# Patient Record
Sex: Male | Born: 2005 | Race: White | Hispanic: No | Marital: Single | Smoking: Never smoker
Health system: Southern US, Community
[De-identification: ages and names within clinical notes are randomized; demographics above are authoritative.]

---
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
## 2023-02-10 ENCOUNTER — Emergency Department: Payer: Managed Care, Other (non HMO)

## 2023-02-10 DIAGNOSIS — W500XXA Accidental hit or strike by another person, initial encounter: Secondary | ICD-10-CM | POA: Insufficient documentation

## 2023-02-10 DIAGNOSIS — S8992XA Unspecified injury of left lower leg, initial encounter: Secondary | ICD-10-CM | POA: Diagnosis present

## 2023-02-10 DIAGNOSIS — S8012XA Contusion of left lower leg, initial encounter: Secondary | ICD-10-CM | POA: Diagnosis not present

## 2023-02-10 DIAGNOSIS — Y9367 Activity, basketball: Secondary | ICD-10-CM | POA: Diagnosis not present

## 2023-02-10 NOTE — ED Triage Notes (Signed)
Playing basketball and took a knee to the top of left thigh. Pt states he initially thought it ' was just a charlie horse, but once I got home it just wasn't getting any better.  Edema and bruising noted to left thigh. Tender to touch. Pt states unable to bear weight on extremity

## 2023-02-11 ENCOUNTER — Emergency Department: Payer: PRIVATE HEALTH INSURANCE

## 2023-02-11 ENCOUNTER — Emergency Department
Admission: EM | Admit: 2023-02-11 | Discharge: 2023-02-11 | Disposition: A | Discharge status: Home / Self Care | Payer: PRIVATE HEALTH INSURANCE | Attending: Emergency Medicine | Admitting: Emergency Medicine

## 2023-02-11 DIAGNOSIS — M79605 Pain in left leg: Secondary | ICD-10-CM

## 2023-02-11 DIAGNOSIS — S8012XA Contusion of left lower leg, initial encounter: Secondary | ICD-10-CM

## 2023-02-11 DIAGNOSIS — Y9367 Activity, basketball: Secondary | ICD-10-CM

## 2023-02-11 MED ORDER — KETOROLAC TROMETHAMINE 30 MG/ML IJ SOLN
30.0000 mg | Freq: Once | INTRAMUSCULAR | Status: AC
  Administered 2023-02-11: 30 mg via INTRAMUSCULAR
  Filled 2023-02-11: qty 1

## 2023-02-11 MED ORDER — OXYCODONE HCL 5 MG PO TABS
5.0000 mg | ORAL_TABLET | Freq: Once | ORAL | Status: AC
  Administered 2023-02-11: 5 mg via ORAL
  Filled 2023-02-11: qty 1

## 2023-02-11 NOTE — ED Provider Notes (Signed)
[facility] Provider Note    Event Date/Time   First MD Initiated Contact with Patient 02/11/23 0136     (approximate)   History   Leg Pain   HPI  Victor Drake is a 17 y.o. male who presents to the ED for evaluation of Leg Pain   Patient presents to the ED with his mother for evaluation of left thigh pain after an accidental injury.  Reports he was playing basketball when another player accidentally struck their knee against his thigh anteriorly.  He reports increasing swelling over the past few hours since the injury over his thigh.  Difficulty ambulating due to pain.  No other injuries beyond his left thigh   Physical Exam   Triage Vital Signs: ED Triage Vitals [02/10/23 2339]  Enc Vitals Group     BP (!) 106/53     Pulse Rate 87     Resp 18     Temp 97.6 F (36.4 C)     Temp Source Oral     SpO2 99 %     Weight 220 lb (99.8 kg)     Height 6\' 5"  (1.956 m)     Head Circumference      Peak Flow      Pain Score 9     Pain Loc      Pain Edu?      Excl. in GC?     Most recent vital signs: Vitals:   02/10/23 2339  BP: (!) 106/53  Pulse: 87  Resp: 18  Temp: 97.6 F (36.4 C)  SpO2: 99%    General: Awake, no distress.  CV:  Good peripheral perfusion.  Resp:  Normal effort.  Abd:  No distention.  MSK:  Closed soft tissue swelling of the thigh/quadriceps on the left.  No laceration or evidence of open injury.  No bruising evident, just soft tissue swelling.  Tender to touch.  Foot and left leg distally is neurovascularly intact. Neuro:  No focal deficits appreciated. Other:     ED Results / Procedures / Treatments   Labs (all labs ordered are listed, but only abnormal results are displayed) Labs Reviewed - No data to display  EKG   RADIOLOGY Plain film of the femur interpreted by me with evidence of fracture or dislocation  Official radiology report(s): DG Femur Min 2 Views Left  Result Date: 02/11/2023 CLINICAL DATA:   Status post trauma. EXAM: LEFT FEMUR 2 VIEWS COMPARISON:  None Available. FINDINGS: There is no evidence of fracture or other focal bone lesions. Soft tissues are unremarkable. IMPRESSION: Negative. Electronically Signed   By: Aram Candela M.D.   On: 02/11/2023 00:20    PROCEDURES and INTERVENTIONS:  Procedures  Medications  ketorolac (TORADOL) 30 MG/ML injection 30 mg (30 mg Intramuscular Given 02/11/23 0150)  oxyCODONE (Oxy IR/ROXICODONE) immediate release tablet 5 mg (5 mg Oral Given 02/11/23 0149)     IMPRESSION / MDM / ASSESSMENT AND PLAN / ED COURSE  I reviewed the triage vital signs and the nursing notes.  Differential diagnosis includes, but is not limited to, active arterial bleed, hematoma, fracture, open injury  {Patient presents with symptoms of an acute illness or injury that is potentially life-threatening.  Healthy 17 year old presents with left thigh swelling and pain after a basketball injury.  I suspect a hematoma and suitable for trial of outpatient management with conservative measures.  X-ray is reassuring.  Clinically neurologically vascularly is reassuring.  Doubt compartment syndrome.  No  rapidly expanding swelling or signs/symptoms of ongoing arterial bleeding.  No clear indications at this point for any arterial imaging.  Feeling better with some RICE therapy and we will discharge with the same alongside crutches.  We discussed on multiple occasions expectant management, care at home and return precautions.  Clinical Course as of 02/11/23 0244  Tue Feb 11, 2023  0221 Reassessed. Doing OK [DS]  0235 reassessed [DS]    Clinical Course User Index [DS] Delton Prairie, MD     FINAL CLINICAL IMPRESSION(S) / ED DIAGNOSES   Final diagnoses:  Left leg pain  Injury while playing basketball  Leg hematoma, left, initial encounter     Rx / DC Orders   ED Discharge Orders     None        Note:  This document was prepared using Dragon voice recognition  software and may include unintentional dictation errors.   Delton Prairie, MD 02/11/23 [phone]

## 2023-02-11 NOTE — Discharge Instructions (Addendum)
Keep an ACE wrap firmly wrapped on that leg pretty much all the time to help with compression.  Ice intermittently over top of this  Please take Tylenol and ibuprofen/Advil for your pain.  It is safe to take them together, or to alternate them every few hours.  Take up to 1000mg  of Tylenol at a time, up to 4 times per day.  Do not take more than 4000 mg of Tylenol in 24 hours.  For ibuprofen, take 400-600 mg, 3 - 4 times per day.  Oxycodone as needed for more severe/breakthrough pain  If your symptoms worsen despite these measures then please return to the ED

## 2023-02-11 NOTE — ED Notes (Signed)
Sts improvement in pain, swelling and mobility with ace bandages applied. Demonstrates proper use of crutches. Denies further questions. Ambulatory with aids on departure.
